# Patient Record
Sex: Male | Born: 1975 | Race: White | Hispanic: No | Marital: Married | Smoking: Never smoker
Health system: Southern US, Community
[De-identification: ages and names within clinical notes are randomized; demographics above are authoritative.]

## PROBLEM LIST (undated history)

## (undated) DIAGNOSIS — E785 Hyperlipidemia, unspecified: Secondary | ICD-10-CM

## (undated) HISTORY — DX: Hyperlipidemia, unspecified: E78.5

---

## 2015-10-24 DIAGNOSIS — H11152 Pinguecula, left eye: Secondary | ICD-10-CM | POA: Diagnosis not present

## 2015-10-24 DIAGNOSIS — L719 Rosacea, unspecified: Secondary | ICD-10-CM | POA: Diagnosis not present

## 2015-10-24 DIAGNOSIS — H15001 Unspecified scleritis, right eye: Secondary | ICD-10-CM | POA: Diagnosis not present

## 2015-11-12 DIAGNOSIS — M9905 Segmental and somatic dysfunction of pelvic region: Secondary | ICD-10-CM | POA: Diagnosis not present

## 2015-11-12 DIAGNOSIS — M545 Low back pain: Secondary | ICD-10-CM | POA: Diagnosis not present

## 2015-11-12 DIAGNOSIS — M9902 Segmental and somatic dysfunction of thoracic region: Secondary | ICD-10-CM | POA: Diagnosis not present

## 2015-11-12 DIAGNOSIS — M9903 Segmental and somatic dysfunction of lumbar region: Secondary | ICD-10-CM | POA: Diagnosis not present

## 2015-11-26 DIAGNOSIS — M9902 Segmental and somatic dysfunction of thoracic region: Secondary | ICD-10-CM | POA: Diagnosis not present

## 2015-11-26 DIAGNOSIS — M9903 Segmental and somatic dysfunction of lumbar region: Secondary | ICD-10-CM | POA: Diagnosis not present

## 2015-11-26 DIAGNOSIS — M9905 Segmental and somatic dysfunction of pelvic region: Secondary | ICD-10-CM | POA: Diagnosis not present

## 2015-11-26 DIAGNOSIS — M545 Low back pain: Secondary | ICD-10-CM | POA: Diagnosis not present

## 2016-01-31 DIAGNOSIS — M9902 Segmental and somatic dysfunction of thoracic region: Secondary | ICD-10-CM | POA: Diagnosis not present

## 2016-01-31 DIAGNOSIS — M9903 Segmental and somatic dysfunction of lumbar region: Secondary | ICD-10-CM | POA: Diagnosis not present

## 2016-01-31 DIAGNOSIS — M9905 Segmental and somatic dysfunction of pelvic region: Secondary | ICD-10-CM | POA: Diagnosis not present

## 2016-01-31 DIAGNOSIS — M545 Low back pain: Secondary | ICD-10-CM | POA: Diagnosis not present

## 2016-06-09 HISTORY — PX: ROTATOR CUFF REPAIR: SHX139

## 2016-07-11 DIAGNOSIS — H5213 Myopia, bilateral: Secondary | ICD-10-CM | POA: Diagnosis not present

## 2016-07-11 DIAGNOSIS — H52223 Regular astigmatism, bilateral: Secondary | ICD-10-CM | POA: Diagnosis not present

## 2016-07-29 DIAGNOSIS — M25512 Pain in left shoulder: Secondary | ICD-10-CM | POA: Diagnosis not present

## 2016-08-27 DIAGNOSIS — M25512 Pain in left shoulder: Secondary | ICD-10-CM | POA: Diagnosis not present

## 2016-08-28 DIAGNOSIS — S46012A Strain of muscle(s) and tendon(s) of the rotator cuff of left shoulder, initial encounter: Secondary | ICD-10-CM | POA: Diagnosis not present

## 2016-09-15 DIAGNOSIS — Y999 Unspecified external cause status: Secondary | ICD-10-CM | POA: Diagnosis not present

## 2016-09-15 DIAGNOSIS — G8918 Other acute postprocedural pain: Secondary | ICD-10-CM | POA: Diagnosis not present

## 2016-09-15 DIAGNOSIS — S46012A Strain of muscle(s) and tendon(s) of the rotator cuff of left shoulder, initial encounter: Secondary | ICD-10-CM | POA: Diagnosis not present

## 2016-09-15 DIAGNOSIS — S43422A Sprain of left rotator cuff capsule, initial encounter: Secondary | ICD-10-CM | POA: Diagnosis not present

## 2016-09-18 DIAGNOSIS — M75122 Complete rotator cuff tear or rupture of left shoulder, not specified as traumatic: Secondary | ICD-10-CM | POA: Diagnosis not present

## 2016-09-26 DIAGNOSIS — M75122 Complete rotator cuff tear or rupture of left shoulder, not specified as traumatic: Secondary | ICD-10-CM | POA: Diagnosis not present

## 2016-10-02 DIAGNOSIS — M75122 Complete rotator cuff tear or rupture of left shoulder, not specified as traumatic: Secondary | ICD-10-CM | POA: Diagnosis not present

## 2016-10-07 DIAGNOSIS — S46012D Strain of muscle(s) and tendon(s) of the rotator cuff of left shoulder, subsequent encounter: Secondary | ICD-10-CM | POA: Diagnosis not present

## 2016-10-10 DIAGNOSIS — M75122 Complete rotator cuff tear or rupture of left shoulder, not specified as traumatic: Secondary | ICD-10-CM | POA: Diagnosis not present

## 2016-10-17 DIAGNOSIS — M75122 Complete rotator cuff tear or rupture of left shoulder, not specified as traumatic: Secondary | ICD-10-CM | POA: Diagnosis not present

## 2016-10-21 DIAGNOSIS — M75122 Complete rotator cuff tear or rupture of left shoulder, not specified as traumatic: Secondary | ICD-10-CM | POA: Diagnosis not present

## 2016-10-21 DIAGNOSIS — S46012D Strain of muscle(s) and tendon(s) of the rotator cuff of left shoulder, subsequent encounter: Secondary | ICD-10-CM | POA: Diagnosis not present

## 2016-10-24 DIAGNOSIS — M75122 Complete rotator cuff tear or rupture of left shoulder, not specified as traumatic: Secondary | ICD-10-CM | POA: Diagnosis not present

## 2016-10-27 DIAGNOSIS — M75122 Complete rotator cuff tear or rupture of left shoulder, not specified as traumatic: Secondary | ICD-10-CM | POA: Diagnosis not present

## 2016-10-29 DIAGNOSIS — M75122 Complete rotator cuff tear or rupture of left shoulder, not specified as traumatic: Secondary | ICD-10-CM | POA: Diagnosis not present

## 2016-11-05 DIAGNOSIS — M75122 Complete rotator cuff tear or rupture of left shoulder, not specified as traumatic: Secondary | ICD-10-CM | POA: Diagnosis not present

## 2016-11-07 DIAGNOSIS — M75122 Complete rotator cuff tear or rupture of left shoulder, not specified as traumatic: Secondary | ICD-10-CM | POA: Diagnosis not present

## 2016-11-11 DIAGNOSIS — M75122 Complete rotator cuff tear or rupture of left shoulder, not specified as traumatic: Secondary | ICD-10-CM | POA: Diagnosis not present

## 2016-11-14 DIAGNOSIS — M75122 Complete rotator cuff tear or rupture of left shoulder, not specified as traumatic: Secondary | ICD-10-CM | POA: Diagnosis not present

## 2016-11-17 DIAGNOSIS — M75122 Complete rotator cuff tear or rupture of left shoulder, not specified as traumatic: Secondary | ICD-10-CM | POA: Diagnosis not present

## 2016-11-21 DIAGNOSIS — M75122 Complete rotator cuff tear or rupture of left shoulder, not specified as traumatic: Secondary | ICD-10-CM | POA: Diagnosis not present

## 2016-11-24 DIAGNOSIS — M75122 Complete rotator cuff tear or rupture of left shoulder, not specified as traumatic: Secondary | ICD-10-CM | POA: Diagnosis not present

## 2016-11-28 DIAGNOSIS — M75122 Complete rotator cuff tear or rupture of left shoulder, not specified as traumatic: Secondary | ICD-10-CM | POA: Diagnosis not present

## 2017-01-15 DIAGNOSIS — S46012D Strain of muscle(s) and tendon(s) of the rotator cuff of left shoulder, subsequent encounter: Secondary | ICD-10-CM | POA: Diagnosis not present

## 2017-01-15 DIAGNOSIS — Z4789 Encounter for other orthopedic aftercare: Secondary | ICD-10-CM | POA: Diagnosis not present

## 2017-01-29 DIAGNOSIS — Z0000 Encounter for general adult medical examination without abnormal findings: Secondary | ICD-10-CM | POA: Diagnosis not present

## 2017-02-03 DIAGNOSIS — Z23 Encounter for immunization: Secondary | ICD-10-CM | POA: Diagnosis not present

## 2017-02-05 DIAGNOSIS — Z111 Encounter for screening for respiratory tuberculosis: Secondary | ICD-10-CM | POA: Diagnosis not present

## 2017-04-28 DIAGNOSIS — L650 Telogen effluvium: Secondary | ICD-10-CM | POA: Diagnosis not present

## 2017-04-28 DIAGNOSIS — L719 Rosacea, unspecified: Secondary | ICD-10-CM | POA: Diagnosis not present

## 2017-07-17 DIAGNOSIS — H11152 Pinguecula, left eye: Secondary | ICD-10-CM | POA: Diagnosis not present

## 2017-07-17 DIAGNOSIS — H52223 Regular astigmatism, bilateral: Secondary | ICD-10-CM | POA: Diagnosis not present

## 2017-07-17 DIAGNOSIS — H35413 Lattice degeneration of retina, bilateral: Secondary | ICD-10-CM | POA: Diagnosis not present

## 2017-07-17 DIAGNOSIS — L719 Rosacea, unspecified: Secondary | ICD-10-CM | POA: Diagnosis not present

## 2017-07-21 DIAGNOSIS — E7849 Other hyperlipidemia: Secondary | ICD-10-CM | POA: Diagnosis not present

## 2017-07-21 DIAGNOSIS — Z0000 Encounter for general adult medical examination without abnormal findings: Secondary | ICD-10-CM | POA: Diagnosis not present

## 2017-07-22 DIAGNOSIS — E7849 Other hyperlipidemia: Secondary | ICD-10-CM | POA: Diagnosis not present

## 2017-07-30 DIAGNOSIS — Z3009 Encounter for other general counseling and advice on contraception: Secondary | ICD-10-CM | POA: Diagnosis not present

## 2017-07-30 DIAGNOSIS — E7849 Other hyperlipidemia: Secondary | ICD-10-CM | POA: Diagnosis not present

## 2017-07-30 DIAGNOSIS — K429 Umbilical hernia without obstruction or gangrene: Secondary | ICD-10-CM | POA: Diagnosis not present

## 2017-07-30 DIAGNOSIS — Z0000 Encounter for general adult medical examination without abnormal findings: Secondary | ICD-10-CM | POA: Diagnosis not present

## 2017-07-30 DIAGNOSIS — Z720 Tobacco use: Secondary | ICD-10-CM | POA: Diagnosis not present

## 2017-07-30 DIAGNOSIS — Z1389 Encounter for screening for other disorder: Secondary | ICD-10-CM | POA: Diagnosis not present

## 2017-08-11 DIAGNOSIS — H35412 Lattice degeneration of retina, left eye: Secondary | ICD-10-CM | POA: Diagnosis not present

## 2017-08-11 DIAGNOSIS — H4389 Other disorders of vitreous body: Secondary | ICD-10-CM | POA: Diagnosis not present

## 2017-09-01 DIAGNOSIS — K429 Umbilical hernia without obstruction or gangrene: Secondary | ICD-10-CM | POA: Diagnosis not present

## 2017-09-14 DIAGNOSIS — Z3009 Encounter for other general counseling and advice on contraception: Secondary | ICD-10-CM | POA: Diagnosis not present

## 2017-10-31 DIAGNOSIS — J029 Acute pharyngitis, unspecified: Secondary | ICD-10-CM | POA: Diagnosis not present

## 2017-11-02 DIAGNOSIS — J029 Acute pharyngitis, unspecified: Secondary | ICD-10-CM | POA: Diagnosis not present

## 2018-02-03 DIAGNOSIS — M545 Low back pain: Secondary | ICD-10-CM | POA: Diagnosis not present

## 2018-02-03 DIAGNOSIS — M9902 Segmental and somatic dysfunction of thoracic region: Secondary | ICD-10-CM | POA: Diagnosis not present

## 2018-02-03 DIAGNOSIS — M9905 Segmental and somatic dysfunction of pelvic region: Secondary | ICD-10-CM | POA: Diagnosis not present

## 2018-02-03 DIAGNOSIS — M9903 Segmental and somatic dysfunction of lumbar region: Secondary | ICD-10-CM | POA: Diagnosis not present

## 2018-03-16 DIAGNOSIS — Z23 Encounter for immunization: Secondary | ICD-10-CM | POA: Diagnosis not present

## 2018-05-13 DIAGNOSIS — M9905 Segmental and somatic dysfunction of pelvic region: Secondary | ICD-10-CM | POA: Diagnosis not present

## 2018-05-13 DIAGNOSIS — M9903 Segmental and somatic dysfunction of lumbar region: Secondary | ICD-10-CM | POA: Diagnosis not present

## 2018-05-13 DIAGNOSIS — M9902 Segmental and somatic dysfunction of thoracic region: Secondary | ICD-10-CM | POA: Diagnosis not present

## 2018-05-13 DIAGNOSIS — M545 Low back pain: Secondary | ICD-10-CM | POA: Diagnosis not present

## 2018-05-19 DIAGNOSIS — M545 Low back pain: Secondary | ICD-10-CM | POA: Diagnosis not present

## 2018-05-19 DIAGNOSIS — M9905 Segmental and somatic dysfunction of pelvic region: Secondary | ICD-10-CM | POA: Diagnosis not present

## 2018-05-19 DIAGNOSIS — M9902 Segmental and somatic dysfunction of thoracic region: Secondary | ICD-10-CM | POA: Diagnosis not present

## 2018-05-19 DIAGNOSIS — M9903 Segmental and somatic dysfunction of lumbar region: Secondary | ICD-10-CM | POA: Diagnosis not present

## 2018-05-21 DIAGNOSIS — K432 Incisional hernia without obstruction or gangrene: Secondary | ICD-10-CM | POA: Diagnosis not present

## 2018-06-09 HISTORY — PX: ACHILLES TENDON REPAIR: SUR1153

## 2018-06-21 DIAGNOSIS — M9905 Segmental and somatic dysfunction of pelvic region: Secondary | ICD-10-CM | POA: Diagnosis not present

## 2018-06-21 DIAGNOSIS — M9902 Segmental and somatic dysfunction of thoracic region: Secondary | ICD-10-CM | POA: Diagnosis not present

## 2018-06-21 DIAGNOSIS — M545 Low back pain: Secondary | ICD-10-CM | POA: Diagnosis not present

## 2018-06-21 DIAGNOSIS — M9903 Segmental and somatic dysfunction of lumbar region: Secondary | ICD-10-CM | POA: Diagnosis not present

## 2018-06-24 DIAGNOSIS — H6980 Other specified disorders of Eustachian tube, unspecified ear: Secondary | ICD-10-CM | POA: Diagnosis not present

## 2018-06-24 DIAGNOSIS — Z6825 Body mass index (BMI) 25.0-25.9, adult: Secondary | ICD-10-CM | POA: Diagnosis not present

## 2018-07-05 DIAGNOSIS — S86012A Strain of left Achilles tendon, initial encounter: Secondary | ICD-10-CM | POA: Diagnosis not present

## 2018-07-13 DIAGNOSIS — M66362 Spontaneous rupture of flexor tendons, left lower leg: Secondary | ICD-10-CM | POA: Diagnosis not present

## 2018-07-29 DIAGNOSIS — R82998 Other abnormal findings in urine: Secondary | ICD-10-CM | POA: Diagnosis not present

## 2018-07-29 DIAGNOSIS — Z125 Encounter for screening for malignant neoplasm of prostate: Secondary | ICD-10-CM | POA: Diagnosis not present

## 2018-07-29 DIAGNOSIS — Z0000 Encounter for general adult medical examination without abnormal findings: Secondary | ICD-10-CM | POA: Diagnosis not present

## 2018-07-29 DIAGNOSIS — E7849 Other hyperlipidemia: Secondary | ICD-10-CM | POA: Diagnosis not present

## 2018-08-06 DIAGNOSIS — K429 Umbilical hernia without obstruction or gangrene: Secondary | ICD-10-CM | POA: Diagnosis not present

## 2018-08-06 DIAGNOSIS — M7662 Achilles tendinitis, left leg: Secondary | ICD-10-CM | POA: Diagnosis not present

## 2018-08-06 DIAGNOSIS — Z1331 Encounter for screening for depression: Secondary | ICD-10-CM | POA: Diagnosis not present

## 2018-08-06 DIAGNOSIS — Z0000 Encounter for general adult medical examination without abnormal findings: Secondary | ICD-10-CM | POA: Diagnosis not present

## 2018-08-06 DIAGNOSIS — L659 Nonscarring hair loss, unspecified: Secondary | ICD-10-CM | POA: Diagnosis not present

## 2018-08-06 DIAGNOSIS — E7849 Other hyperlipidemia: Secondary | ICD-10-CM | POA: Diagnosis not present

## 2018-08-10 ENCOUNTER — Other Ambulatory Visit: Payer: Self-pay | Admitting: Internal Medicine

## 2018-08-10 DIAGNOSIS — E785 Hyperlipidemia, unspecified: Secondary | ICD-10-CM

## 2018-08-16 ENCOUNTER — Ambulatory Visit
Admission: RE | Admit: 2018-08-16 | Discharge: 2018-08-16 | Disposition: A | Discharge status: Home / Self Care | Payer: No Typology Code available for payment source | Source: Ambulatory Visit | Attending: Internal Medicine | Admitting: Internal Medicine

## 2018-08-16 DIAGNOSIS — E785 Hyperlipidemia, unspecified: Secondary | ICD-10-CM

## 2018-10-15 ENCOUNTER — Ambulatory Visit: Payer: Self-pay | Admitting: General Surgery

## 2018-10-29 DIAGNOSIS — Z03818 Encounter for observation for suspected exposure to other biological agents ruled out: Secondary | ICD-10-CM | POA: Diagnosis not present

## 2019-04-04 DIAGNOSIS — K429 Umbilical hernia without obstruction or gangrene: Secondary | ICD-10-CM | POA: Diagnosis not present

## 2019-04-07 DIAGNOSIS — K429 Umbilical hernia without obstruction or gangrene: Secondary | ICD-10-CM | POA: Diagnosis not present

## 2019-04-15 DIAGNOSIS — H52223 Regular astigmatism, bilateral: Secondary | ICD-10-CM | POA: Diagnosis not present

## 2019-08-05 DIAGNOSIS — E7849 Other hyperlipidemia: Secondary | ICD-10-CM | POA: Diagnosis not present

## 2019-08-05 DIAGNOSIS — Z125 Encounter for screening for malignant neoplasm of prostate: Secondary | ICD-10-CM | POA: Diagnosis not present

## 2019-08-05 DIAGNOSIS — Z0000 Encounter for general adult medical examination without abnormal findings: Secondary | ICD-10-CM | POA: Diagnosis not present

## 2019-09-16 DIAGNOSIS — L659 Nonscarring hair loss, unspecified: Secondary | ICD-10-CM | POA: Diagnosis not present

## 2019-09-16 DIAGNOSIS — Z3009 Encounter for other general counseling and advice on contraception: Secondary | ICD-10-CM | POA: Diagnosis not present

## 2019-09-16 DIAGNOSIS — Z1331 Encounter for screening for depression: Secondary | ICD-10-CM | POA: Diagnosis not present

## 2019-09-16 DIAGNOSIS — E785 Hyperlipidemia, unspecified: Secondary | ICD-10-CM | POA: Diagnosis not present

## 2019-09-16 DIAGNOSIS — Z0000 Encounter for general adult medical examination without abnormal findings: Secondary | ICD-10-CM | POA: Diagnosis not present

## 2019-09-16 DIAGNOSIS — K429 Umbilical hernia without obstruction or gangrene: Secondary | ICD-10-CM | POA: Diagnosis not present

## 2019-09-20 DIAGNOSIS — Z1212 Encounter for screening for malignant neoplasm of rectum: Secondary | ICD-10-CM | POA: Diagnosis not present

## 2019-09-20 DIAGNOSIS — R82998 Other abnormal findings in urine: Secondary | ICD-10-CM | POA: Diagnosis not present

## 2019-10-12 DIAGNOSIS — M9905 Segmental and somatic dysfunction of pelvic region: Secondary | ICD-10-CM | POA: Diagnosis not present

## 2019-10-12 DIAGNOSIS — M9902 Segmental and somatic dysfunction of thoracic region: Secondary | ICD-10-CM | POA: Diagnosis not present

## 2019-10-12 DIAGNOSIS — M545 Low back pain: Secondary | ICD-10-CM | POA: Diagnosis not present

## 2019-10-12 DIAGNOSIS — M9903 Segmental and somatic dysfunction of lumbar region: Secondary | ICD-10-CM | POA: Diagnosis not present

## 2019-11-01 DIAGNOSIS — M9903 Segmental and somatic dysfunction of lumbar region: Secondary | ICD-10-CM | POA: Diagnosis not present

## 2019-11-01 DIAGNOSIS — M545 Low back pain: Secondary | ICD-10-CM | POA: Diagnosis not present

## 2019-11-01 DIAGNOSIS — M9902 Segmental and somatic dysfunction of thoracic region: Secondary | ICD-10-CM | POA: Diagnosis not present

## 2019-11-01 DIAGNOSIS — M9905 Segmental and somatic dysfunction of pelvic region: Secondary | ICD-10-CM | POA: Diagnosis not present

## 2019-11-08 DIAGNOSIS — M9905 Segmental and somatic dysfunction of pelvic region: Secondary | ICD-10-CM | POA: Diagnosis not present

## 2019-11-08 DIAGNOSIS — M9902 Segmental and somatic dysfunction of thoracic region: Secondary | ICD-10-CM | POA: Diagnosis not present

## 2019-11-08 DIAGNOSIS — M545 Low back pain: Secondary | ICD-10-CM | POA: Diagnosis not present

## 2019-11-08 DIAGNOSIS — M9903 Segmental and somatic dysfunction of lumbar region: Secondary | ICD-10-CM | POA: Diagnosis not present

## 2019-11-17 DIAGNOSIS — L820 Inflamed seborrheic keratosis: Secondary | ICD-10-CM | POA: Diagnosis not present

## 2019-11-21 DIAGNOSIS — M9901 Segmental and somatic dysfunction of cervical region: Secondary | ICD-10-CM | POA: Diagnosis not present

## 2019-11-21 DIAGNOSIS — M7542 Impingement syndrome of left shoulder: Secondary | ICD-10-CM | POA: Diagnosis not present

## 2019-11-21 DIAGNOSIS — M542 Cervicalgia: Secondary | ICD-10-CM | POA: Diagnosis not present

## 2019-11-21 DIAGNOSIS — M9902 Segmental and somatic dysfunction of thoracic region: Secondary | ICD-10-CM | POA: Diagnosis not present

## 2020-07-11 DIAGNOSIS — Z03818 Encounter for observation for suspected exposure to other biological agents ruled out: Secondary | ICD-10-CM | POA: Diagnosis not present

## 2020-07-11 DIAGNOSIS — Z1152 Encounter for screening for COVID-19: Secondary | ICD-10-CM | POA: Diagnosis not present

## 2020-07-11 DIAGNOSIS — Z20822 Contact with and (suspected) exposure to covid-19: Secondary | ICD-10-CM | POA: Diagnosis not present

## 2020-09-01 IMAGING — CT CT HEART SCORING
3 series · 13 of 20 positions shown, 15 images · non-contrast
Comparison: None.

CLINICAL DATA: 42-year-old Caucasian male.  Hyperlipidemia.

EXAM:
CT HEART FOR CALCIUM SCORING
TECHNIQUE: CT heart was performed using prospective ECG gating.
A non-contrast exam for calcium scoring was performed.
Note that this exam targets the heart and the chest was not imaged
in its entirety.

[Series 2: calcium scoring 2.00 qr36 bestdiast 67% · axial · 0.39mm/px · z∈[+1631,+1695]mm · 3 of 80 slices shown]
[im 16/80  vessel]
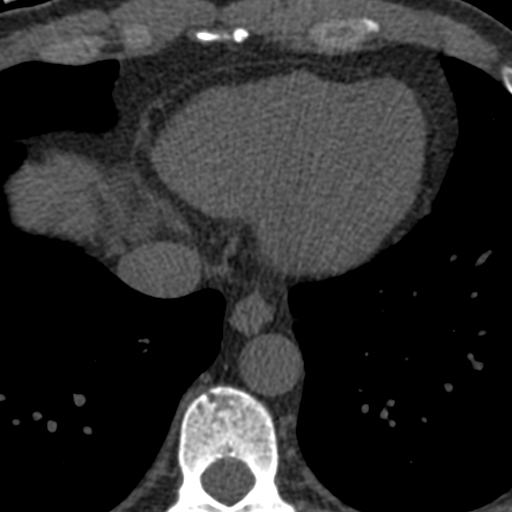
[im 32/80  vessel]
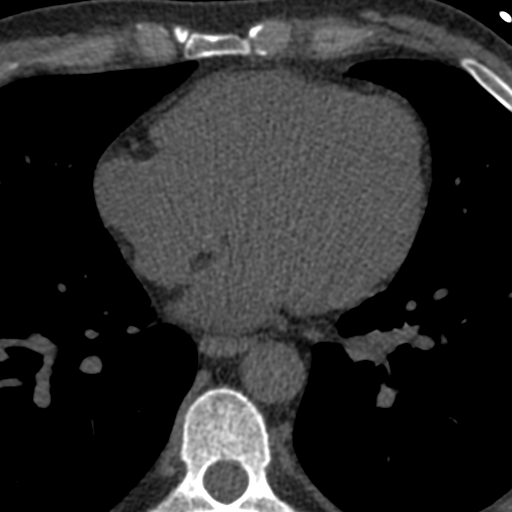
[im 48/80  vessel]
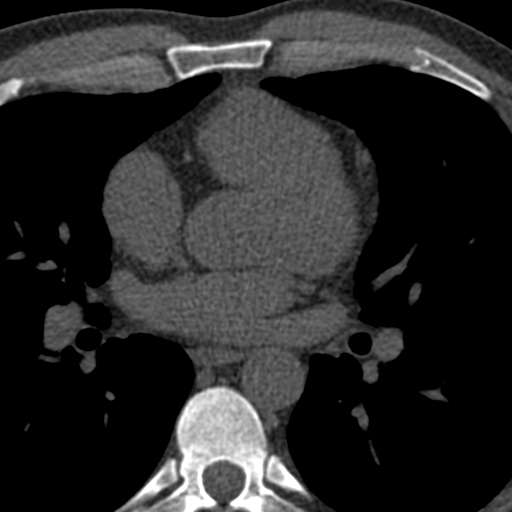

[Series 3: calcium scoring 2.00 br40 bestdiast 67% ax fov · axial · 0.55mm/px · z∈[+1627,+1731]mm · 5 of 80 slices shown, 7 images]
[im 14/80  vessel]
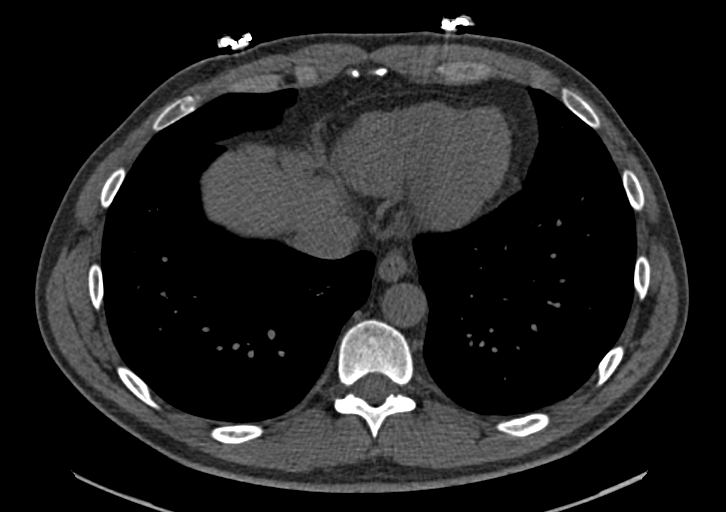
[im 14/80  lung]
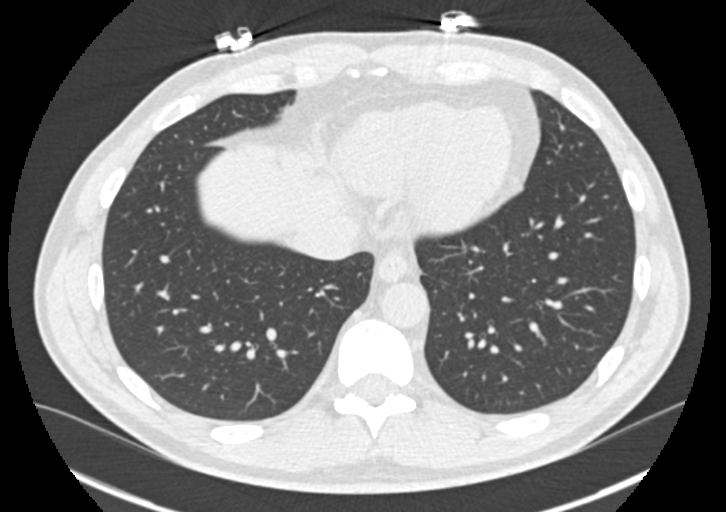
[im 27/80  vessel]
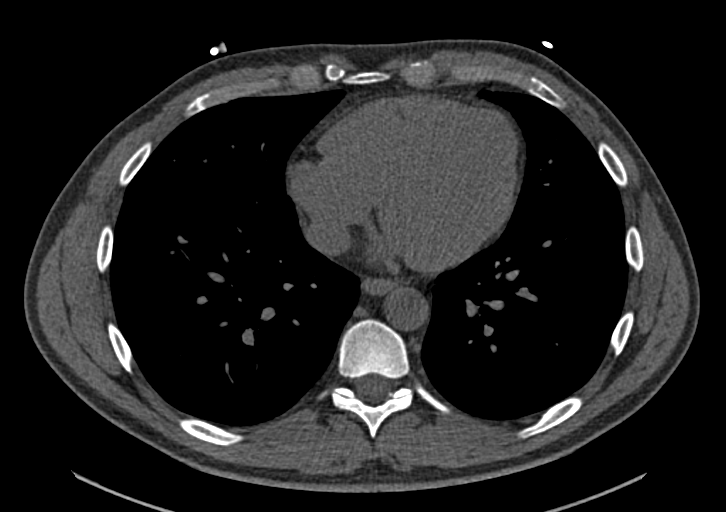
[im 40/80  vessel]
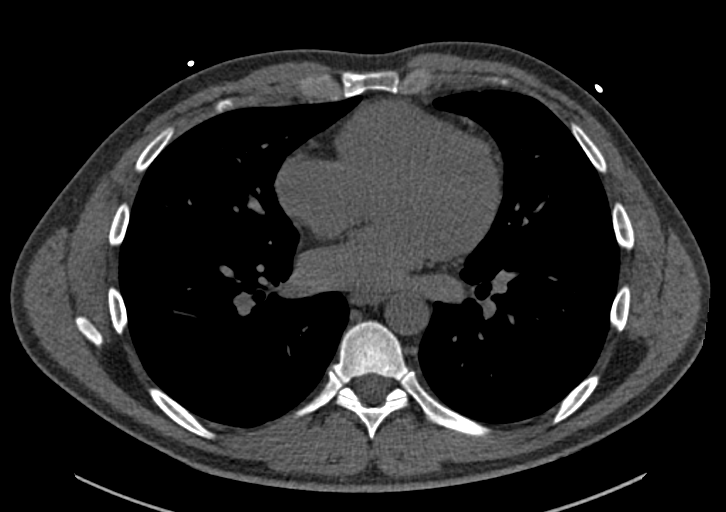
[im 53/80  vessel]
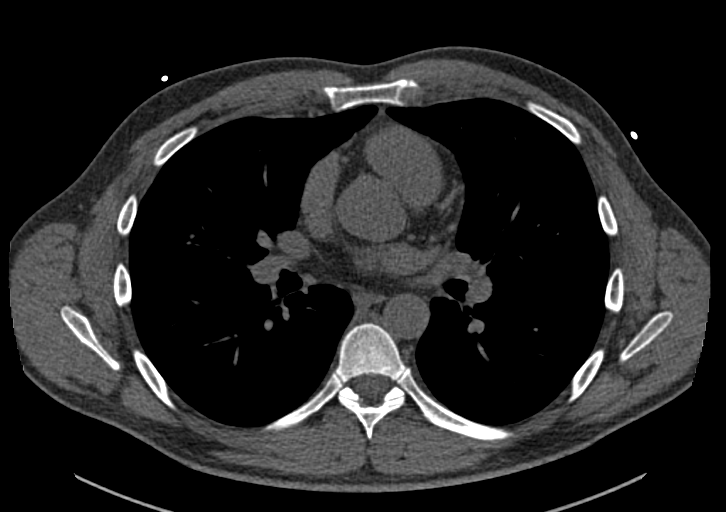
[im 66/80  vessel]
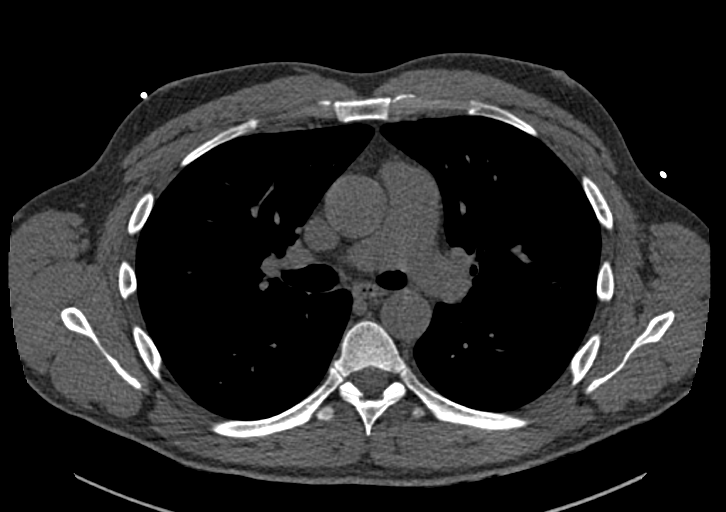
[im 66/80  lung]
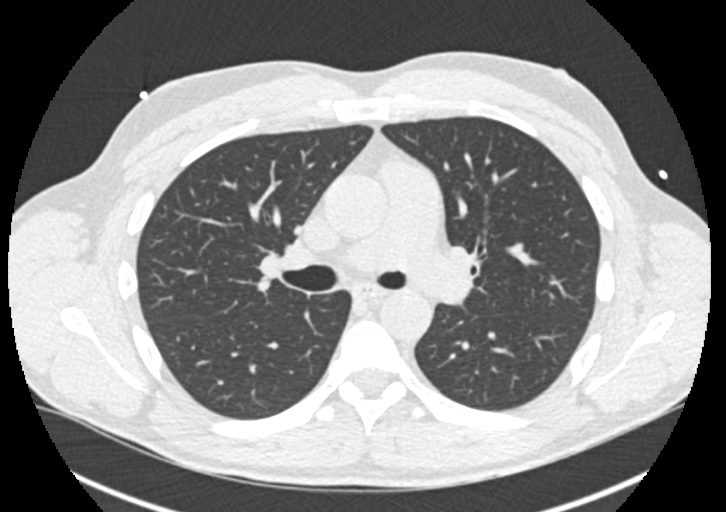

[Series 9: calcium scoring 2.00 br60 bestdiast 67% ax fov · axial · 0.55mm/px · z∈[+1627,+1731]mm · 5 of 80 slices shown]
[im 14/80  vessel]
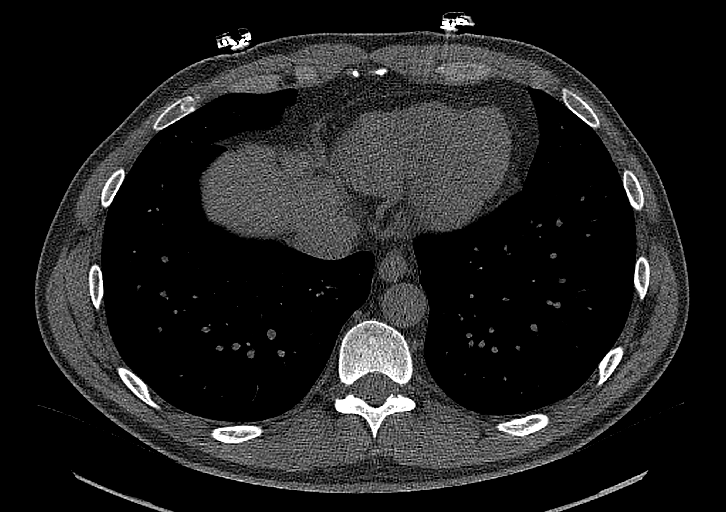
[im 27/80  vessel]
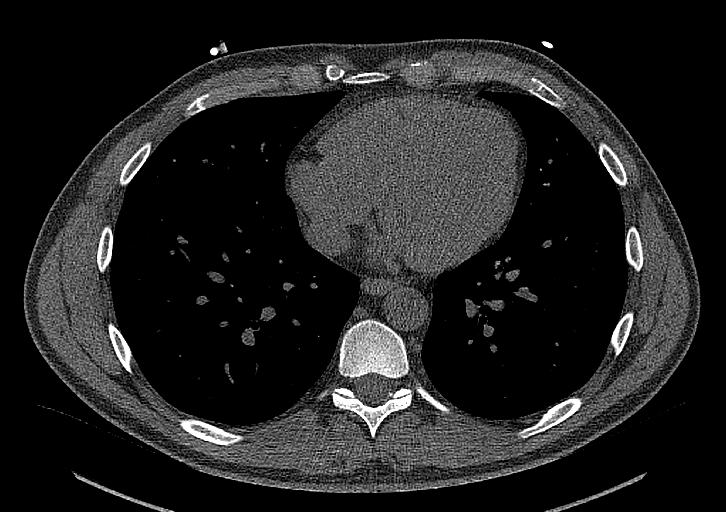
[im 40/80  vessel]
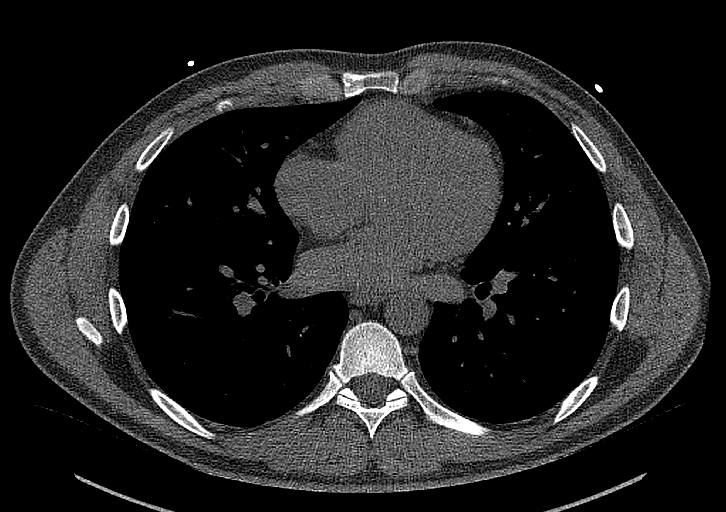
[im 53/80  vessel]
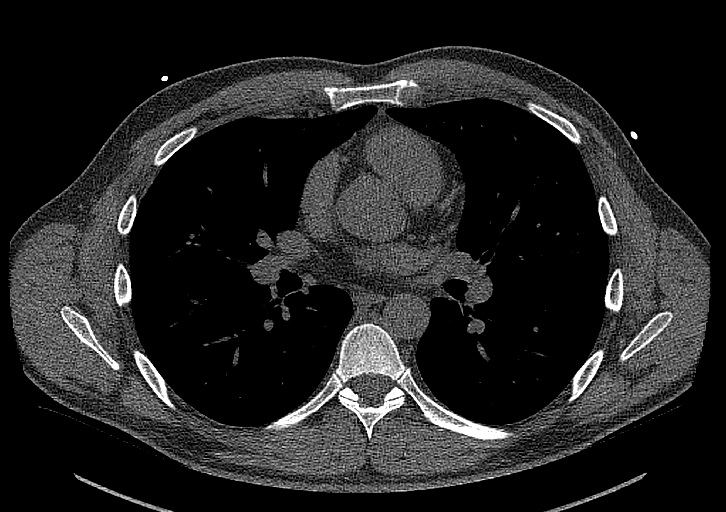
[im 66/80  vessel]
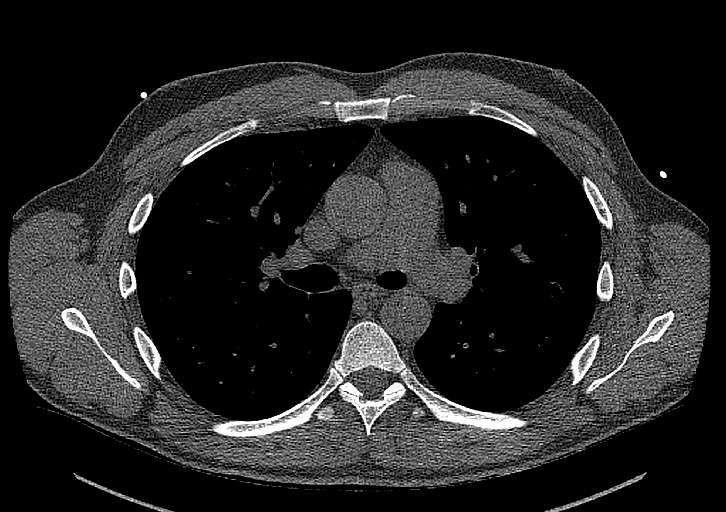

[13 of 20 positions shown; findings below may reference images not displayed]

FINDINGS: Technical quality: Good

No coronary artery calcification.

CORONARY CALCIUM

Total Agatston Score: 0

[HOSPITAL] percentile: 0 th

Ascending aorta ( <  40 mm): 34 mm

EXTRACARDIAC FINDINGS:

Limited view of the lung parenchyma demonstrates no suspicious
nodularity. Airways are normal.

Limited view of the mediastinum demonstrates no adenopathy.
Esophagus normal.

Limited view of the upper abdomen unremarkable.

Limited view of the skeleton and chest wall is unremarkable.
IMPRESSION: 1. No coronary artery calcification.

2. Total Agatston Score: 0

3. MESA age and sex matched database percentile: 0th

## 2020-11-13 DIAGNOSIS — H35412 Lattice degeneration of retina, left eye: Secondary | ICD-10-CM | POA: Diagnosis not present

## 2020-11-13 DIAGNOSIS — L719 Rosacea, unspecified: Secondary | ICD-10-CM | POA: Diagnosis not present

## 2020-11-13 DIAGNOSIS — H11153 Pinguecula, bilateral: Secondary | ICD-10-CM | POA: Diagnosis not present

## 2020-11-14 DIAGNOSIS — M9902 Segmental and somatic dysfunction of thoracic region: Secondary | ICD-10-CM | POA: Diagnosis not present

## 2020-11-14 DIAGNOSIS — M9905 Segmental and somatic dysfunction of pelvic region: Secondary | ICD-10-CM | POA: Diagnosis not present

## 2020-11-14 DIAGNOSIS — M9903 Segmental and somatic dysfunction of lumbar region: Secondary | ICD-10-CM | POA: Diagnosis not present

## 2020-11-14 DIAGNOSIS — M5451 Vertebrogenic low back pain: Secondary | ICD-10-CM | POA: Diagnosis not present

## 2020-12-18 DIAGNOSIS — L814 Other melanin hyperpigmentation: Secondary | ICD-10-CM | POA: Diagnosis not present

## 2020-12-18 DIAGNOSIS — L570 Actinic keratosis: Secondary | ICD-10-CM | POA: Diagnosis not present

## 2020-12-18 DIAGNOSIS — L650 Telogen effluvium: Secondary | ICD-10-CM | POA: Diagnosis not present

## 2020-12-26 DIAGNOSIS — M9905 Segmental and somatic dysfunction of pelvic region: Secondary | ICD-10-CM | POA: Diagnosis not present

## 2020-12-26 DIAGNOSIS — M5451 Vertebrogenic low back pain: Secondary | ICD-10-CM | POA: Diagnosis not present

## 2020-12-26 DIAGNOSIS — M9903 Segmental and somatic dysfunction of lumbar region: Secondary | ICD-10-CM | POA: Diagnosis not present

## 2020-12-26 DIAGNOSIS — M9902 Segmental and somatic dysfunction of thoracic region: Secondary | ICD-10-CM | POA: Diagnosis not present

## 2021-03-20 DIAGNOSIS — L570 Actinic keratosis: Secondary | ICD-10-CM | POA: Diagnosis not present

## 2021-04-09 DIAGNOSIS — M7542 Impingement syndrome of left shoulder: Secondary | ICD-10-CM | POA: Diagnosis not present

## 2021-04-09 DIAGNOSIS — M9901 Segmental and somatic dysfunction of cervical region: Secondary | ICD-10-CM | POA: Diagnosis not present

## 2021-04-09 DIAGNOSIS — M542 Cervicalgia: Secondary | ICD-10-CM | POA: Diagnosis not present

## 2021-04-09 DIAGNOSIS — M9902 Segmental and somatic dysfunction of thoracic region: Secondary | ICD-10-CM | POA: Diagnosis not present

## 2021-06-10 DIAGNOSIS — M9902 Segmental and somatic dysfunction of thoracic region: Secondary | ICD-10-CM | POA: Diagnosis not present

## 2021-06-10 DIAGNOSIS — M7542 Impingement syndrome of left shoulder: Secondary | ICD-10-CM | POA: Diagnosis not present

## 2021-06-10 DIAGNOSIS — M9901 Segmental and somatic dysfunction of cervical region: Secondary | ICD-10-CM | POA: Diagnosis not present

## 2021-06-10 DIAGNOSIS — M542 Cervicalgia: Secondary | ICD-10-CM | POA: Diagnosis not present

## 2021-08-08 DIAGNOSIS — M7542 Impingement syndrome of left shoulder: Secondary | ICD-10-CM | POA: Diagnosis not present

## 2021-08-08 DIAGNOSIS — M9902 Segmental and somatic dysfunction of thoracic region: Secondary | ICD-10-CM | POA: Diagnosis not present

## 2021-08-08 DIAGNOSIS — M9901 Segmental and somatic dysfunction of cervical region: Secondary | ICD-10-CM | POA: Diagnosis not present

## 2021-08-08 DIAGNOSIS — M542 Cervicalgia: Secondary | ICD-10-CM | POA: Diagnosis not present

## 2021-09-11 DIAGNOSIS — M9901 Segmental and somatic dysfunction of cervical region: Secondary | ICD-10-CM | POA: Diagnosis not present

## 2021-09-11 DIAGNOSIS — M9902 Segmental and somatic dysfunction of thoracic region: Secondary | ICD-10-CM | POA: Diagnosis not present

## 2021-09-11 DIAGNOSIS — M542 Cervicalgia: Secondary | ICD-10-CM | POA: Diagnosis not present

## 2021-09-11 DIAGNOSIS — M7542 Impingement syndrome of left shoulder: Secondary | ICD-10-CM | POA: Diagnosis not present

## 2021-11-06 DIAGNOSIS — M542 Cervicalgia: Secondary | ICD-10-CM | POA: Diagnosis not present

## 2021-11-06 DIAGNOSIS — M9901 Segmental and somatic dysfunction of cervical region: Secondary | ICD-10-CM | POA: Diagnosis not present

## 2021-11-06 DIAGNOSIS — M9902 Segmental and somatic dysfunction of thoracic region: Secondary | ICD-10-CM | POA: Diagnosis not present

## 2021-11-06 DIAGNOSIS — M7542 Impingement syndrome of left shoulder: Secondary | ICD-10-CM | POA: Diagnosis not present

## 2021-12-19 DIAGNOSIS — H35413 Lattice degeneration of retina, bilateral: Secondary | ICD-10-CM | POA: Diagnosis not present

## 2021-12-19 DIAGNOSIS — H52223 Regular astigmatism, bilateral: Secondary | ICD-10-CM | POA: Diagnosis not present

## 2021-12-19 DIAGNOSIS — H11153 Pinguecula, bilateral: Secondary | ICD-10-CM | POA: Diagnosis not present

## 2021-12-30 DIAGNOSIS — M9901 Segmental and somatic dysfunction of cervical region: Secondary | ICD-10-CM | POA: Diagnosis not present

## 2021-12-30 DIAGNOSIS — M542 Cervicalgia: Secondary | ICD-10-CM | POA: Diagnosis not present

## 2021-12-30 DIAGNOSIS — M9902 Segmental and somatic dysfunction of thoracic region: Secondary | ICD-10-CM | POA: Diagnosis not present

## 2021-12-30 DIAGNOSIS — M7542 Impingement syndrome of left shoulder: Secondary | ICD-10-CM | POA: Diagnosis not present

## 2022-01-27 DIAGNOSIS — M9907 Segmental and somatic dysfunction of upper extremity: Secondary | ICD-10-CM | POA: Diagnosis not present

## 2022-01-27 DIAGNOSIS — M9902 Segmental and somatic dysfunction of thoracic region: Secondary | ICD-10-CM | POA: Diagnosis not present

## 2022-01-27 DIAGNOSIS — M9901 Segmental and somatic dysfunction of cervical region: Secondary | ICD-10-CM | POA: Diagnosis not present

## 2022-01-27 DIAGNOSIS — M542 Cervicalgia: Secondary | ICD-10-CM | POA: Diagnosis not present

## 2022-01-27 DIAGNOSIS — M7542 Impingement syndrome of left shoulder: Secondary | ICD-10-CM | POA: Diagnosis not present

## 2022-08-01 DIAGNOSIS — Z125 Encounter for screening for malignant neoplasm of prostate: Secondary | ICD-10-CM | POA: Diagnosis not present

## 2022-08-01 DIAGNOSIS — E785 Hyperlipidemia, unspecified: Secondary | ICD-10-CM | POA: Diagnosis not present

## 2022-08-05 DIAGNOSIS — R82998 Other abnormal findings in urine: Secondary | ICD-10-CM | POA: Diagnosis not present

## 2022-08-05 DIAGNOSIS — E785 Hyperlipidemia, unspecified: Secondary | ICD-10-CM | POA: Diagnosis not present

## 2022-08-05 DIAGNOSIS — K429 Umbilical hernia without obstruction or gangrene: Secondary | ICD-10-CM | POA: Diagnosis not present

## 2022-08-05 DIAGNOSIS — Z0000 Encounter for general adult medical examination without abnormal findings: Secondary | ICD-10-CM | POA: Diagnosis not present

## 2022-08-05 DIAGNOSIS — Z87891 Personal history of nicotine dependence: Secondary | ICD-10-CM | POA: Diagnosis not present

## 2022-08-05 DIAGNOSIS — Z1331 Encounter for screening for depression: Secondary | ICD-10-CM | POA: Diagnosis not present

## 2022-08-05 DIAGNOSIS — L659 Nonscarring hair loss, unspecified: Secondary | ICD-10-CM | POA: Diagnosis not present

## 2022-08-05 DIAGNOSIS — Z23 Encounter for immunization: Secondary | ICD-10-CM | POA: Diagnosis not present

## 2022-12-24 ENCOUNTER — Encounter: Payer: Self-pay | Admitting: Gastroenterology

## 2022-12-24 ENCOUNTER — Ambulatory Visit: Payer: BC Managed Care – PPO

## 2022-12-24 VITALS — Ht 71.0 in | Wt 180.0 lb

## 2022-12-24 DIAGNOSIS — H52223 Regular astigmatism, bilateral: Secondary | ICD-10-CM | POA: Diagnosis not present

## 2022-12-24 DIAGNOSIS — Z1211 Encounter for screening for malignant neoplasm of colon: Secondary | ICD-10-CM

## 2022-12-24 DIAGNOSIS — H35413 Lattice degeneration of retina, bilateral: Secondary | ICD-10-CM | POA: Diagnosis not present

## 2022-12-24 MED ORDER — NA SULFATE-K SULFATE-MG SULF 17.5-3.13-1.6 GM/177ML PO SOLN: 1.0000 | Freq: Once | ORAL | 0 refills | Status: AC

## 2022-12-24 NOTE — Progress Notes (Signed)
No egg or soy allergy known to patient  No issues known to pt with past sedation with any surgeries or procedures Patient denies ever being told they had issues or difficulty with intubation  No FH of Malignant Hyperthermia Pt is not on diet pills Pt is not on  home 02  Pt is not on blood thinners  Pt denies issues with constipation  No A fib or A flutter Have any cardiac testing pending--no Pt instructed to use Singlecare.com or GoodRx for a price reduction on prep  Patient's chart reviewed by John Nulty CNRA prior to previsit and patient appropriate for the [facility].  Previsit completed and red dot placed by patient's name on their procedure day (on provider's schedule).   Can ambulate independently 

## 2023-01-02 ENCOUNTER — Encounter: Payer: No Typology Code available for payment source | Admitting: Gastroenterology

## 2023-01-14 ENCOUNTER — Encounter: Payer: No Typology Code available for payment source | Admitting: Gastroenterology

## 2023-01-19 DIAGNOSIS — M9907 Segmental and somatic dysfunction of upper extremity: Secondary | ICD-10-CM | POA: Diagnosis not present

## 2023-01-19 DIAGNOSIS — M9902 Segmental and somatic dysfunction of thoracic region: Secondary | ICD-10-CM | POA: Diagnosis not present

## 2023-01-19 DIAGNOSIS — M7542 Impingement syndrome of left shoulder: Secondary | ICD-10-CM | POA: Diagnosis not present

## 2023-01-19 DIAGNOSIS — M9903 Segmental and somatic dysfunction of lumbar region: Secondary | ICD-10-CM | POA: Diagnosis not present

## 2023-01-19 DIAGNOSIS — M9901 Segmental and somatic dysfunction of cervical region: Secondary | ICD-10-CM | POA: Diagnosis not present

## 2023-01-20 ENCOUNTER — Ambulatory Visit (AMBULATORY_SURGERY_CENTER): Payer: BC Managed Care – PPO | Admitting: Gastroenterology

## 2023-01-20 ENCOUNTER — Encounter: Payer: Self-pay | Admitting: Gastroenterology

## 2023-01-20 VITALS — BP 137/92 | HR 68 | Temp 97.5°F | Resp 10 | Ht 70.0 in | Wt 180.0 lb

## 2023-01-20 DIAGNOSIS — K635 Polyp of colon: Secondary | ICD-10-CM | POA: Diagnosis not present

## 2023-01-20 DIAGNOSIS — Z1211 Encounter for screening for malignant neoplasm of colon: Secondary | ICD-10-CM

## 2023-01-20 DIAGNOSIS — D122 Benign neoplasm of ascending colon: Secondary | ICD-10-CM | POA: Diagnosis not present

## 2023-01-20 DIAGNOSIS — K640 First degree hemorrhoids: Secondary | ICD-10-CM

## 2023-01-20 MED ORDER — SODIUM CHLORIDE 0.9 % IV SOLN: 500.0000 mL | Freq: Once | INTRAVENOUS | Status: DC

## 2023-01-20 NOTE — Progress Notes (Signed)
Vss nad trans to pacu 

## 2023-01-20 NOTE — Patient Instructions (Signed)
Resume all of your present medications today.  Read all of the handouts given to you by your recovery room nurse.  YOU HAD AN ENDOSCOPIC PROCEDURE TODAY AT THE [facility]:   Refer to the procedure report that was given to you for any specific questions about what was found during the examination.  If the procedure report does not answer your questions, please call your gastroenterologist to clarify.  If you requested that your care partner not be given the details of your procedure findings, then the procedure report has been included in a sealed envelope for you to review at your convenience later.  YOU SHOULD EXPECT: Some feelings of bloating in the abdomen. Passage of more gas than usual.  Walking can help get rid of the air that was put into your GI tract during the procedure and reduce the bloating. If you had a lower endoscopy (such as a colonoscopy or flexible sigmoidoscopy) you may notice spotting of blood in your stool or on the toilet paper. If you underwent a bowel prep for your procedure, you may not have a normal bowel movement for a few days.  Please Note:  You might notice some irritation and congestion in your nose or some drainage.  This is from the oxygen used during your procedure.  There is no need for concern and it should clear up in a day or so.  SYMPTOMS TO REPORT IMMEDIATELY:  Following lower endoscopy (colonoscopy or flexible sigmoidoscopy):  Excessive amounts of blood in the stool  Significant tenderness or worsening of abdominal pains  Swelling of the abdomen that is new, acute  Fever of 100F or higher   For urgent or emergent issues, a gastroenterologist can be reached at any hour by calling [phone]. Do not use MyChart messaging for urgent concerns.    DIET:  We do recommend a small meal at first, but then you may proceed to your regular diet.  Drink plenty of fluids but you should avoid alcoholic beverages for 24 hours.  ACTIVITY:  You should  plan to take it easy for the rest of today and you should NOT DRIVE or use heavy machinery until tomorrow (because of the sedation medicines used during the test).    FOLLOW UP: Our staff will call the number listed on your records the next business day following your procedure.  We will call around 7:15- 8:00 am to check on you and address any questions or concerns that you may have regarding the information given to you following your procedure. If we do not reach you, we will leave a message.     If any biopsies were taken you will be contacted by phone or by letter within the next 1-3 weeks.  Please call us at [phone] if you have not heard about the biopsies in 3 weeks.    SIGNATURES/CONFIDENTIALITY: You and/or your care partner have signed paperwork which will be entered into your electronic medical record.  These signatures attest to the fact that that the information above on your After Visit Summary has been reviewed and is understood.  Full responsibility of the confidentiality of this discharge information lies with you and/or your care-partner.

## 2023-01-20 NOTE — Progress Notes (Signed)
Called to room to assist during endoscopic procedure.  Patient ID and intended procedure confirmed with present staff. Received instructions for my participation in the procedure from the performing physician.  

## 2023-01-20 NOTE — Progress Notes (Signed)
GASTROENTEROLOGY PROCEDURE H&P NOTE   Primary Care Physician: Creola Corn, MD    Reason for Procedure:  Colon Cancer screening  Plan:    Colonoscopy  Patient is appropriate for endoscopic procedure(s) in the ambulatory ([facility]) setting.  The nature of the procedure, as well as the risks, benefits, and alternatives were carefully and thoroughly reviewed with the patient. Ample time for discussion and questions allowed. The patient understood, was satisfied, and agreed to proceed.     HPI: Glenn Lamb is a 47 y.o. male who presents for colonoscopy for routine Colon Cancer screening.  No active GI symptoms.    Family history n/f father with colon polyps. Patient is otherwise without complaints or active issues today.  Past Medical History:  Diagnosis Date   Hyperlipidemia     Past Surgical History:  Procedure Laterality Date   ACHILLES TENDON REPAIR Left 2020   ROTATOR CUFF REPAIR Left 2018    Prior to Admission medications   Medication Sig Start Date End Date Taking? Authorizing Provider  finasteride (PROPECIA) 1 MG tablet Take 1 mg by mouth daily.   Yes [provider]  Multiple Vitamins-Minerals (MENS MULTIVITAMIN PLUS PO) Take 1 tablet by mouth daily. 09/05/13  Yes [provider]  Omega-3 Fatty Acids ([employer] FISH OIL) 1000 MG CAPS take 2 tabs daily Oral 09/05/13  Yes [provider]    Current Outpatient Medications  Medication Sig Dispense Refill   finasteride (PROPECIA) 1 MG tablet Take 1 mg by mouth daily.     Multiple Vitamins-Minerals (MENS MULTIVITAMIN PLUS PO) Take 1 tablet by mouth daily.     Omega-3 Fatty Acids ([employer] FISH OIL) 1000 MG CAPS take 2 tabs daily Oral     Current Facility-Administered Medications  Medication Dose Route Frequency Provider Last Rate Last Admin   0.9 %  sodium chloride infusion  500 mL Intravenous Once Jaziyah Gradel V, DO        Allergies as of 01/20/2023 - Review Complete 01/20/2023  Allergen Reaction  Noted   Bee pollen  12/24/2022    Family History  Problem Relation Age of Onset   Colon polyps Father    Colon cancer Neg Hx    Esophageal cancer Neg Hx    Rectal cancer Neg Hx    Stomach cancer Neg Hx     Social History   Socioeconomic History   Marital status: Married    Spouse name: Not on file   Number of children: Not on file   Years of education: Not on file   Highest education level: Not on file  Occupational History   Not on file  Tobacco Use   Smoking status: Never   Smokeless tobacco: Never  Vaping Use   Vaping status: Never Used  Substance and Sexual Activity   Alcohol use: Yes    Alcohol/week: 6.0 standard drinks of alcohol    Types: 6 Shots of liquor per week    Comment: 6-10   Drug use: Not Currently   Sexual activity: Not on file  Other Topics Concern   Not on file  Social History Narrative   Not on file   Social Determinants of Health   Financial Resource Strain: Not on file  Food Insecurity: Not on file  Transportation Needs: Not on file  Physical Activity: Not on file  Stress: Not on file  Social Connections: Not on file  Intimate Partner Violence: Not on file    Physical Exam: Vital signs in last 24  hours: @BP  123/76   Pulse 80   Temp (!) 97.5 F (36.4 C)   Ht 5\' 10"  (1.778 m)   Wt 180 lb (81.6 kg)   SpO2 98%   BMI 25.83 kg/m  GEN: NAD EYE: Sclerae anicteric ENT: MMM CV: Non-tachycardic Pulm: CTA b/l GI: Soft, NT/ND NEURO:  Alert & Oriented x 3   Doristine Locks, DO [facility]   01/20/2023 9:04 AM

## 2023-01-20 NOTE — Op Note (Signed)
[facility] Patient Name: Glenn Lamb Procedure Date: 01/20/2023 8:58 AM MRN: [id number] Endoscopist: Doristine Locks , MD, [id number] Age: 47 Referring MD:  Date of Birth: 14-Aug-1975 Gender: Male Account #: [id number] Procedure:                Colonoscopy Indications:              Screening for colorectal malignant neoplasm, This                            is the patient's first colonoscopy Medicines:                Monitored Anesthesia Care Procedure:                Pre-Anesthesia Assessment:                           - Prior to the procedure, a History and Physical                            was performed, and patient medications and                            allergies were reviewed. The patient's tolerance of                            previous anesthesia was also reviewed. The risks                            and benefits of the procedure and the sedation                            options and risks were discussed with the patient.                            All questions were answered, and informed consent                            was obtained. Prior Anticoagulants: The patient has                            taken no anticoagulant or antiplatelet agents. ASA                            Grade Assessment: II - A patient with mild systemic                            disease. After reviewing the risks and benefits,                            the patient was deemed in satisfactory condition to                            undergo the procedure.  After obtaining informed consent, the colonoscope                            was passed under direct vision. Throughout the                            procedure, the patient's blood pressure, pulse, and                            oxygen saturations were monitored continuously. The                            Olympus CF-HQ190L ([id number]) Colonoscope was                            introduced through the anus and  advanced to the the                            terminal ileum. The colonoscopy was performed                            without difficulty. The patient tolerated the                            procedure well. The quality of the bowel                            preparation was excellent. The terminal ileum,                            ileocecal valve, appendiceal orifice, and rectum                            were photographed. Scope In: 9:12:41 AM Scope Out: 9:30:02 AM Scope Withdrawal Time: 0 hours 13 minutes 53 seconds  Total Procedure Duration: 0 hours 17 minutes 21 seconds  Findings:                 The perianal and digital rectal examinations were                            normal.                           A 6 mm polyp was found in the ascending colon. The                            polyp was mucous-capped and sessile. The polyp was                            removed with a cold snare. Resection and retrieval                            were complete. Estimated blood loss was minimal.  The exam was otherwise normal throughout the                            remainder of the colon.                           Non-bleeding internal hemorrhoids were found during                            retroflexion. The hemorrhoids were small.                           The terminal ileum appeared normal. Complications:            No immediate complications. Estimated Blood Loss:     Estimated blood loss was minimal. Impression:               - One 6 mm polyp in the ascending colon, removed                            with a cold snare. Resected and retrieved.                           - Non-bleeding internal hemorrhoids.                           - The examined portion of the ileum was normal. Recommendation:           - Patient has a contact number available for                            emergencies. The signs and symptoms of potential                            delayed  complications were discussed with the                            patient. Return to normal activities tomorrow.                            Written discharge instructions were provided to the                            patient.                           - Resume previous diet.                           - Continue present medications.                           - Await pathology results.                           - Repeat colonoscopy for surveillance based on  pathology results.                           - Return to GI office PRN. Doristine Locks, MD 01/20/2023 9:35:36 AM

## 2023-01-21 ENCOUNTER — Telehealth: Payer: Self-pay

## 2023-01-21 NOTE — Telephone Encounter (Signed)
  Follow up Call-     01/20/2023    8:07 AM  Call back number  Post procedure Call Back phone  # [phone]  Permission to leave phone message Yes     Patient questions:  Do you have a fever, pain , or abdominal swelling? No. Pain Score  0 *  Have you tolerated food without any problems? Yes.    Have you been able to return to your normal activities? Yes.    Do you have any questions about your discharge instructions: Diet   No. Medications  No. Follow up visit  No.  Do you have questions or concerns about your Care? No.  Actions: * If pain score is 4 or above: No action needed, pain <4.

## 2023-01-27 ENCOUNTER — Encounter: Payer: Self-pay | Admitting: Gastroenterology

## 2023-02-02 DIAGNOSIS — M9903 Segmental and somatic dysfunction of lumbar region: Secondary | ICD-10-CM | POA: Diagnosis not present

## 2023-02-02 DIAGNOSIS — M9902 Segmental and somatic dysfunction of thoracic region: Secondary | ICD-10-CM | POA: Diagnosis not present

## 2023-02-02 DIAGNOSIS — M9907 Segmental and somatic dysfunction of upper extremity: Secondary | ICD-10-CM | POA: Diagnosis not present

## 2023-02-02 DIAGNOSIS — M7542 Impingement syndrome of left shoulder: Secondary | ICD-10-CM | POA: Diagnosis not present

## 2023-02-02 DIAGNOSIS — M9901 Segmental and somatic dysfunction of cervical region: Secondary | ICD-10-CM | POA: Diagnosis not present

## 2023-02-13 DIAGNOSIS — M9907 Segmental and somatic dysfunction of upper extremity: Secondary | ICD-10-CM | POA: Diagnosis not present

## 2023-02-13 DIAGNOSIS — M7542 Impingement syndrome of left shoulder: Secondary | ICD-10-CM | POA: Diagnosis not present

## 2023-02-13 DIAGNOSIS — M9901 Segmental and somatic dysfunction of cervical region: Secondary | ICD-10-CM | POA: Diagnosis not present

## 2023-02-13 DIAGNOSIS — M9902 Segmental and somatic dysfunction of thoracic region: Secondary | ICD-10-CM | POA: Diagnosis not present

## 2023-02-13 DIAGNOSIS — M9903 Segmental and somatic dysfunction of lumbar region: Secondary | ICD-10-CM | POA: Diagnosis not present

## 2023-03-05 DIAGNOSIS — M9907 Segmental and somatic dysfunction of upper extremity: Secondary | ICD-10-CM | POA: Diagnosis not present

## 2023-03-05 DIAGNOSIS — M9902 Segmental and somatic dysfunction of thoracic region: Secondary | ICD-10-CM | POA: Diagnosis not present

## 2023-03-05 DIAGNOSIS — M9901 Segmental and somatic dysfunction of cervical region: Secondary | ICD-10-CM | POA: Diagnosis not present

## 2023-03-05 DIAGNOSIS — M9903 Segmental and somatic dysfunction of lumbar region: Secondary | ICD-10-CM | POA: Diagnosis not present

## 2023-03-05 DIAGNOSIS — M7542 Impingement syndrome of left shoulder: Secondary | ICD-10-CM | POA: Diagnosis not present

## 2023-04-02 DIAGNOSIS — L650 Telogen effluvium: Secondary | ICD-10-CM | POA: Diagnosis not present

## 2023-04-14 DIAGNOSIS — M9901 Segmental and somatic dysfunction of cervical region: Secondary | ICD-10-CM | POA: Diagnosis not present

## 2023-04-14 DIAGNOSIS — M9903 Segmental and somatic dysfunction of lumbar region: Secondary | ICD-10-CM | POA: Diagnosis not present

## 2023-04-14 DIAGNOSIS — M9907 Segmental and somatic dysfunction of upper extremity: Secondary | ICD-10-CM | POA: Diagnosis not present

## 2023-04-14 DIAGNOSIS — M9902 Segmental and somatic dysfunction of thoracic region: Secondary | ICD-10-CM | POA: Diagnosis not present

## 2023-04-14 DIAGNOSIS — M7542 Impingement syndrome of left shoulder: Secondary | ICD-10-CM | POA: Diagnosis not present

## 2023-04-28 DIAGNOSIS — M7542 Impingement syndrome of left shoulder: Secondary | ICD-10-CM | POA: Diagnosis not present

## 2023-04-28 DIAGNOSIS — M9902 Segmental and somatic dysfunction of thoracic region: Secondary | ICD-10-CM | POA: Diagnosis not present

## 2023-04-28 DIAGNOSIS — M9903 Segmental and somatic dysfunction of lumbar region: Secondary | ICD-10-CM | POA: Diagnosis not present

## 2023-04-28 DIAGNOSIS — M9907 Segmental and somatic dysfunction of upper extremity: Secondary | ICD-10-CM | POA: Diagnosis not present

## 2023-04-28 DIAGNOSIS — M9901 Segmental and somatic dysfunction of cervical region: Secondary | ICD-10-CM | POA: Diagnosis not present

## 2023-08-24 DIAGNOSIS — Z125 Encounter for screening for malignant neoplasm of prostate: Secondary | ICD-10-CM | POA: Diagnosis not present

## 2023-08-24 DIAGNOSIS — Z0000 Encounter for general adult medical examination without abnormal findings: Secondary | ICD-10-CM | POA: Diagnosis not present

## 2023-08-24 DIAGNOSIS — E785 Hyperlipidemia, unspecified: Secondary | ICD-10-CM | POA: Diagnosis not present

## 2023-09-07 DIAGNOSIS — Z1331 Encounter for screening for depression: Secondary | ICD-10-CM | POA: Diagnosis not present

## 2023-09-07 DIAGNOSIS — Z0000 Encounter for general adult medical examination without abnormal findings: Secondary | ICD-10-CM | POA: Diagnosis not present

## 2023-09-07 DIAGNOSIS — Z1339 Encounter for screening examination for other mental health and behavioral disorders: Secondary | ICD-10-CM | POA: Diagnosis not present

## 2023-09-07 DIAGNOSIS — R82998 Other abnormal findings in urine: Secondary | ICD-10-CM | POA: Diagnosis not present

## 2023-10-29 DIAGNOSIS — H5319 Other subjective visual disturbances: Secondary | ICD-10-CM | POA: Diagnosis not present

## 2023-10-29 DIAGNOSIS — H35412 Lattice degeneration of retina, left eye: Secondary | ICD-10-CM | POA: Diagnosis not present

## 2023-11-09 DIAGNOSIS — H3321 Serous retinal detachment, right eye: Secondary | ICD-10-CM | POA: Diagnosis not present

## 2023-12-22 DIAGNOSIS — H3321 Serous retinal detachment, right eye: Secondary | ICD-10-CM | POA: Diagnosis not present

## 2024-01-19 DIAGNOSIS — M9901 Segmental and somatic dysfunction of cervical region: Secondary | ICD-10-CM | POA: Diagnosis not present

## 2024-01-19 DIAGNOSIS — M7542 Impingement syndrome of left shoulder: Secondary | ICD-10-CM | POA: Diagnosis not present

## 2024-01-19 DIAGNOSIS — M9903 Segmental and somatic dysfunction of lumbar region: Secondary | ICD-10-CM | POA: Diagnosis not present

## 2024-01-19 DIAGNOSIS — M9902 Segmental and somatic dysfunction of thoracic region: Secondary | ICD-10-CM | POA: Diagnosis not present

## 2024-01-21 DIAGNOSIS — M9901 Segmental and somatic dysfunction of cervical region: Secondary | ICD-10-CM | POA: Diagnosis not present

## 2024-01-21 DIAGNOSIS — M7542 Impingement syndrome of left shoulder: Secondary | ICD-10-CM | POA: Diagnosis not present

## 2024-01-21 DIAGNOSIS — M9903 Segmental and somatic dysfunction of lumbar region: Secondary | ICD-10-CM | POA: Diagnosis not present

## 2024-01-21 DIAGNOSIS — M9902 Segmental and somatic dysfunction of thoracic region: Secondary | ICD-10-CM | POA: Diagnosis not present
